# Patient Record
Sex: Male | Born: 1970 | Race: Black or African American | Hispanic: No | Marital: Single | Smoking: Current every day smoker
Health system: Southern US, Community
[De-identification: ages and names within clinical notes are randomized; demographics above are authoritative.]

---

## 2018-09-29 DIAGNOSIS — M545 Low back pain, unspecified: Secondary | ICD-10-CM | POA: Insufficient documentation

## 2018-09-29 DIAGNOSIS — G8929 Other chronic pain: Secondary | ICD-10-CM | POA: Insufficient documentation

## 2018-09-29 DIAGNOSIS — M23204 Derangement of unspecified medial meniscus due to old tear or injury, left knee: Secondary | ICD-10-CM | POA: Insufficient documentation

## 2018-09-29 DIAGNOSIS — M542 Cervicalgia: Secondary | ICD-10-CM | POA: Insufficient documentation

## 2018-09-29 DIAGNOSIS — K089 Disorder of teeth and supporting structures, unspecified: Secondary | ICD-10-CM | POA: Insufficient documentation

## 2022-01-14 ENCOUNTER — Emergency Department (HOSPITAL_BASED_OUTPATIENT_CLINIC_OR_DEPARTMENT_OTHER)
Admission: EM | Admit: 2022-01-14 | Discharge: 2022-01-14 | Disposition: A | Discharge status: Home / Self Care | Payer: Medicaid Other | Attending: Emergency Medicine | Admitting: Emergency Medicine

## 2022-01-14 ENCOUNTER — Other Ambulatory Visit (HOSPITAL_BASED_OUTPATIENT_CLINIC_OR_DEPARTMENT_OTHER): Payer: Self-pay

## 2022-01-14 ENCOUNTER — Encounter (HOSPITAL_BASED_OUTPATIENT_CLINIC_OR_DEPARTMENT_OTHER): Payer: Self-pay | Admitting: Emergency Medicine

## 2022-01-14 ENCOUNTER — Emergency Department (HOSPITAL_BASED_OUTPATIENT_CLINIC_OR_DEPARTMENT_OTHER): Payer: Medicaid Other

## 2022-01-14 ENCOUNTER — Other Ambulatory Visit: Payer: Self-pay

## 2022-01-14 DIAGNOSIS — M25572 Pain in left ankle and joints of left foot: Secondary | ICD-10-CM | POA: Insufficient documentation

## 2022-01-14 DIAGNOSIS — M25562 Pain in left knee: Secondary | ICD-10-CM | POA: Diagnosis not present

## 2022-01-14 DIAGNOSIS — S161XXA Strain of muscle, fascia and tendon at neck level, initial encounter: Secondary | ICD-10-CM | POA: Diagnosis not present

## 2022-01-14 DIAGNOSIS — Y92410 Unspecified street and highway as the place of occurrence of the external cause: Secondary | ICD-10-CM | POA: Insufficient documentation

## 2022-01-14 DIAGNOSIS — M549 Dorsalgia, unspecified: Secondary | ICD-10-CM | POA: Diagnosis not present

## 2022-01-14 DIAGNOSIS — M25571 Pain in right ankle and joints of right foot: Secondary | ICD-10-CM | POA: Diagnosis not present

## 2022-01-14 DIAGNOSIS — S199XXA Unspecified injury of neck, initial encounter: Secondary | ICD-10-CM | POA: Diagnosis present

## 2022-01-14 DIAGNOSIS — M25561 Pain in right knee: Secondary | ICD-10-CM | POA: Insufficient documentation
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
## 2022-01-14 MED ORDER — HYDROCODONE-ACETAMINOPHEN 5-325 MG PO TABS
1.0000 | ORAL_TABLET | Freq: Once | ORAL | Status: AC
  Administered 2022-01-14: 1 via ORAL
  Filled 2022-01-14: qty 1

## 2022-01-14 MED ORDER — NAPROXEN 375 MG PO TABS
375.0000 mg | ORAL_TABLET | Freq: Two times a day (BID) | ORAL | 0 refills | Status: DC
  Filled 2022-01-14: qty 20, 10d supply, fill #0

## 2022-01-14 MED ORDER — LIDOCAINE 5 % EX PTCH
1.0000 | MEDICATED_PATCH | CUTANEOUS | 0 refills | Status: AC
Start: 2022-01-14 — End: ?
  Filled 2022-01-14: qty 30, 30d supply, fill #0

## 2022-01-14 MED ORDER — METHOCARBAMOL 500 MG PO TABS
500.0000 mg | ORAL_TABLET | Freq: Two times a day (BID) | ORAL | 0 refills | Status: DC
  Filled 2022-01-14: qty 20, 10d supply, fill #0
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
## 2022-01-14 NOTE — ED Notes (Signed)
ED PA in to evaluate client, also has pain in left ankle, left ankle appears slightly swollen, also c/o bilateral knee pain, able to ambulate without assistance, additional radiology studies of left ankle to be done as well per ED MD orders.

## 2022-01-14 NOTE — ED Notes (Signed)
AVS reviewed with client, discussed pain control techniques to use with Rx provided as well. GCS 15, Resp status WNL. States he still feels sore, but pain in head is improving.
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
## 2022-01-16 ENCOUNTER — Encounter: Payer: Self-pay | Admitting: Critical Care Medicine

## 2022-01-16 ENCOUNTER — Ambulatory Visit: Payer: Medicaid Other | Attending: Critical Care Medicine | Admitting: Critical Care Medicine

## 2022-01-16 ENCOUNTER — Other Ambulatory Visit: Payer: Self-pay

## 2022-01-16 VITALS — BP 128/78 | HR 73 | Wt 156.2 lb

## 2022-01-16 DIAGNOSIS — G8929 Other chronic pain: Secondary | ICD-10-CM | POA: Diagnosis not present

## 2022-01-16 DIAGNOSIS — M542 Cervicalgia: Secondary | ICD-10-CM | POA: Insufficient documentation

## 2022-01-16 DIAGNOSIS — M545 Low back pain, unspecified: Secondary | ICD-10-CM | POA: Diagnosis not present

## 2022-01-16 MED ORDER — MELOXICAM 15 MG PO TABS
15.0000 mg | ORAL_TABLET | Freq: Every day | ORAL | 0 refills | Status: AC
  Filled 2022-01-16: qty 30, 30d supply, fill #0

## 2022-01-16 MED ORDER — CYCLOBENZAPRINE HCL 10 MG PO TABS
10.0000 mg | ORAL_TABLET | Freq: Three times a day (TID) | ORAL | 0 refills | Status: AC | PRN
  Filled 2022-01-16: qty 30, 10d supply, fill #0
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
## 2022-01-16 NOTE — Patient Instructions (Signed)
MRI of the neck and lumbar spine will be made  Trial of cyclobenzaprine 10 mg 3 times daily as needed and meloxicam 15 mg daily for neck and back pain will be made this was sent to our pharmacy  Stop the methocarbamol and Naprosyn  We may consider physical therapy we will get imaging studies first we will call you with results

## 2022-01-16 NOTE — Assessment & Plan Note (Signed)
As per chronic back pain assessment

## 2022-01-16 NOTE — Assessment & Plan Note (Signed)
Patient states he does not have prior history of back and neck problems however there is a history from [facility] of chronic neck and back pain.

## 2022-01-16 NOTE — Assessment & Plan Note (Signed)
We will obtain an MRI of the lumbar spine

## 2022-01-20 ENCOUNTER — Ambulatory Visit: Payer: Self-pay

## 2022-01-20 NOTE — Telephone Encounter (Signed)
Called pt and advised phone appt for tomorrow to discuss issues.
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
## 2022-01-21 ENCOUNTER — Ambulatory Visit: Payer: Medicaid Other | Attending: Nurse Practitioner | Admitting: Nurse Practitioner

## 2022-01-21 ENCOUNTER — Telehealth: Payer: Self-pay | Admitting: Critical Care Medicine

## 2022-01-21 ENCOUNTER — Encounter: Payer: Self-pay | Admitting: Nurse Practitioner

## 2022-01-21 DIAGNOSIS — M545 Low back pain, unspecified: Secondary | ICD-10-CM

## 2022-01-21 DIAGNOSIS — M542 Cervicalgia: Secondary | ICD-10-CM | POA: Diagnosis not present

## 2022-01-21 MED ORDER — MISC. DEVICES MISC: 0 refills | Status: AC

## 2022-01-21 NOTE — Telephone Encounter (Signed)
Pt got appt sooner

## 2022-01-21 NOTE — Telephone Encounter (Signed)
Copied from CRM [id number]. Topic: General - Other >> Jan 20, 2022 12:18 PM [place], [place] wrote: Reason for CRM:pt states that he needs order for mri to be sent stat, because the June 17 appt is too far away and he needs sooner appt
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
## 2022-01-28 ENCOUNTER — Ambulatory Visit
Admission: RE | Admit: 2022-01-28 | Discharge: 2022-01-28 | Disposition: A | Discharge status: Home / Self Care | Payer: Medicaid Other | Source: Ambulatory Visit | Attending: Critical Care Medicine | Admitting: Critical Care Medicine

## 2022-01-28 ENCOUNTER — Other Ambulatory Visit: Payer: Self-pay | Admitting: Critical Care Medicine

## 2022-01-28 DIAGNOSIS — M542 Cervicalgia: Secondary | ICD-10-CM

## 2022-01-28 DIAGNOSIS — M545 Low back pain, unspecified: Secondary | ICD-10-CM
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
## 2022-01-28 NOTE — Progress Notes (Signed)
Patient aware of results and is being sent to neurosurgery

## 2022-01-28 NOTE — Progress Notes (Signed)
Patient aware of results he has mild disc bulge C5-C6 and C6-C7 he would likely benefit from neurosurgery consultation and potentially injections in the cervical spine it does not appear to need any type of surgical intervention  He may also benefit from physical therapy I will place the referral
